# Patient Record
Sex: Female | Born: 1995 | Race: Black or African American | Hispanic: No | Marital: Single | Smoking: Current every day smoker
Health system: Southern US, Community
[De-identification: ages and names within clinical notes are randomized; demographics above are authoritative.]

## PROBLEM LIST (undated history)

## (undated) DIAGNOSIS — F909 Attention-deficit hyperactivity disorder, unspecified type: Secondary | ICD-10-CM

---
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
## 2016-08-31 ENCOUNTER — Encounter (HOSPITAL_COMMUNITY): Payer: Self-pay | Admitting: Emergency Medicine

## 2016-08-31 ENCOUNTER — Emergency Department (HOSPITAL_COMMUNITY)
Admission: EM | Admit: 2016-08-31 | Discharge: 2016-08-31 | Disposition: A | Discharge status: Home / Self Care | Payer: Medicaid Other | Attending: Emergency Medicine | Admitting: Emergency Medicine

## 2016-08-31 ENCOUNTER — Emergency Department (HOSPITAL_COMMUNITY): Payer: Medicaid Other

## 2016-08-31 DIAGNOSIS — Z79899 Other long term (current) drug therapy: Secondary | ICD-10-CM | POA: Insufficient documentation

## 2016-08-31 DIAGNOSIS — F17210 Nicotine dependence, cigarettes, uncomplicated: Secondary | ICD-10-CM | POA: Diagnosis not present

## 2016-08-31 DIAGNOSIS — J069 Acute upper respiratory infection, unspecified: Secondary | ICD-10-CM

## 2016-08-31 DIAGNOSIS — R05 Cough: Secondary | ICD-10-CM | POA: Diagnosis present

## 2016-08-31 MED ORDER — BENZONATATE 100 MG PO CAPS: 100.0000 mg | ORAL_CAPSULE | Freq: Three times a day (TID) | ORAL | 0 refills | Status: AC | PRN

## 2016-08-31 NOTE — ED Notes (Signed)
Pt well appearing, alert and oriented. Ambulates off unit at discharge

## 2016-08-31 NOTE — ED Notes (Signed)
Pt returned to room from xray.

## 2016-08-31 NOTE — ED Triage Notes (Signed)
Pt to ER for evaluation of productive cough and nasal drainage x2 days. VSS. Denies SOB. Lungs clear.

## 2016-08-31 NOTE — ED Provider Notes (Signed)
[facility] Provider Note   CSN: [id number] Arrival date & time: 08/31/16  1247     History   Chief Complaint Chief Complaint  Patient presents with  . URI    HPI Renee Hodge is a 21 y.o. female.  The history is provided by the patient. No language interpreter was used.  Cough  This is a new problem. The current episode started yesterday. The problem occurs every few minutes. The problem has not changed since onset.The cough is non-productive. Associated symptoms include rhinorrhea and sore throat. Pertinent negatives include no ear congestion, no ear pain, no shortness of breath and no wheezing. She has tried nothing for the symptoms. Her past medical history does not include pneumonia or asthma.    Past Medical History:  Diagnosis Date  . Attention deficit hyperactivity disorder     There are no active problems to display for this patient.   History reviewed. No pertinent surgical history.  OB History    No data available       Home Medications    Prior to Admission medications   Medication Sig Start Date End Date Taking? Authorizing Provider  amphetamine-dextroamphetamine (ADDERALL XR) 25 MG 24 hr capsule Take 25 mg by mouth every morning.    [provider]  benzonatate (TESSALON PERLES) 100 MG capsule Take 1 capsule (100 mg total) by mouth 3 (three) times daily as needed for cough. 08/31/16   [name], MD    Family History History reviewed. No pertinent family history.  Social History Social History  Substance Use Topics  . Smoking status: Current Every Day Smoker    Packs/day: 0.50    Types: Cigarettes  . Smokeless tobacco: Never Used  . Alcohol use No     Allergies   Patient has no known allergies.   Review of Systems Review of Systems  Constitutional: Negative for activity change, appetite change and fever.  HENT: Positive for congestion, rhinorrhea and sore throat. Negative for ear discharge and ear pain.     Respiratory: Negative for cough, shortness of breath and wheezing.   Gastrointestinal: Negative for abdominal pain, diarrhea, nausea and vomiting.  Genitourinary: Negative for decreased urine volume.  Skin: Negative for rash.  Neurological: Negative for weakness.     Physical Exam Updated Vital Signs BP 117/76 (BP Location: Right Arm)   Pulse 88   Temp 98.9 F (37.2 C) (Oral)   Resp 18   LMP 08/31/2016 (Exact Date)   SpO2 100%   Physical Exam  Constitutional: She appears well-developed and well-nourished. No distress.  HENT:  Head: Normocephalic and atraumatic.  Right Ear: External ear normal.  Left Ear: External ear normal.  Nose: Nose normal.  Eyes: Pupils are equal, round, and reactive to light. Conjunctivae are normal.  Neck: Neck supple.  Cardiovascular: Normal rate, regular rhythm, normal heart sounds and intact distal pulses.   No murmur heard. Pulmonary/Chest: Effort normal and breath sounds normal. No respiratory distress. She has no wheezes. She has no rales. She exhibits no tenderness.  Abdominal: Soft. There is no tenderness.  Lymphadenopathy:    She has no cervical adenopathy.  Neurological: She is alert. She exhibits normal muscle tone. Coordination normal.  Skin: Skin is warm. Capillary refill takes less than 2 seconds. No rash noted.  Psychiatric: She has a normal mood and affect.  Nursing note and vitals reviewed.    ED Treatments / Results  Labs (all labs ordered are listed, but only abnormal results are displayed)  Labs Reviewed - No data to display  EKG  EKG Interpretation None       Radiology Dg Chest 2 View  Result Date: 08/31/2016 CLINICAL DATA:  Cough for 2 days. EXAM: CHEST  2 VIEW COMPARISON:  None. FINDINGS: The heart size and mediastinal contours are within normal limits. Both lungs are clear. The visualized skeletal structures are unremarkable. IMPRESSION: No active cardiopulmonary disease. Electronically Signed   By: Elige Ko    On: 08/31/2016 14:06    Procedures Procedures (including critical care time)  Medications Ordered in ED Medications - No data to display   Initial Impression / Assessment and Plan / ED Course  I have reviewed the triage vital signs and the nursing notes.  Pertinent labs & imaging results that were available during my care of the patient were reviewed by me and considered in my medical decision making (see chart for details).     21 year old previously healthy female presents with 2 days of cough. Patient also reports upper respiratory congestion and sore throat. No previous history of albuterol use. She is eating and drinking normally. She denies fever, rash, difficulty breathing, abdominal pain or other associated symptoms. Vaccinations up-to-date.  On exam, patient is coughing frequently but in no acute distress. She appears well-hydrated. Her lungs are clear to auscultation bilaterally without increased work of breathing. Her posterior oropharynx is clear. Her abdomen soft nontender palpation.  Chest x-ray obtained and shows no acute cardiopulmonary process.  History and exam is consistent with URI. Recommend supportive care for symptomatic management. Patient given Jerilynn Som for cough. Return precautions discussed prior to discharge.  Final Clinical Impressions(s) / ED Diagnoses   Final diagnoses:  Upper respiratory tract infection, unspecified type    New Prescriptions New Prescriptions   BENZONATATE (TESSALON PERLES) 100 MG CAPSULE    Take 1 capsule (100 mg total) by mouth 3 (three) times daily as needed for cough.     [name], MD 08/31/16 [phone]

## 2016-08-31 NOTE — ED Notes (Signed)
Patient transported to X-ray 

## 2016-10-04 ENCOUNTER — Emergency Department (HOSPITAL_COMMUNITY)
Admission: EM | Admit: 2016-10-04 | Discharge: 2016-10-04 | Disposition: A | Discharge status: Home / Self Care | Payer: Medicaid Other | Attending: Emergency Medicine | Admitting: Emergency Medicine

## 2016-10-04 ENCOUNTER — Emergency Department (HOSPITAL_COMMUNITY): Payer: Medicaid Other

## 2016-10-04 ENCOUNTER — Encounter (HOSPITAL_COMMUNITY): Payer: Self-pay | Admitting: Emergency Medicine

## 2016-10-04 DIAGNOSIS — W458XXA Other foreign body or object entering through skin, initial encounter: Secondary | ICD-10-CM | POA: Diagnosis not present

## 2016-10-04 DIAGNOSIS — Y92008 Other place in unspecified non-institutional (private) residence as the place of occurrence of the external cause: Secondary | ICD-10-CM | POA: Diagnosis not present

## 2016-10-04 DIAGNOSIS — S61532A Puncture wound without foreign body of left wrist, initial encounter: Secondary | ICD-10-CM | POA: Diagnosis not present

## 2016-10-04 DIAGNOSIS — T148XXA Other injury of unspecified body region, initial encounter: Secondary | ICD-10-CM

## 2016-10-04 DIAGNOSIS — Z79899 Other long term (current) drug therapy: Secondary | ICD-10-CM | POA: Insufficient documentation

## 2016-10-04 DIAGNOSIS — Y999 Unspecified external cause status: Secondary | ICD-10-CM | POA: Diagnosis not present

## 2016-10-04 DIAGNOSIS — S6992XA Unspecified injury of left wrist, hand and finger(s), initial encounter: Secondary | ICD-10-CM | POA: Diagnosis present

## 2016-10-04 DIAGNOSIS — Y939 Activity, unspecified: Secondary | ICD-10-CM | POA: Insufficient documentation

## 2016-10-04 DIAGNOSIS — F17210 Nicotine dependence, cigarettes, uncomplicated: Secondary | ICD-10-CM | POA: Diagnosis not present

## 2016-10-04 NOTE — Discharge Instructions (Signed)
Apply ice intermittently. 3-4 times per day, 20 minutes at a time. Wear splint until pain-free. No limitations of use of the hand

## 2016-10-04 NOTE — ED Triage Notes (Signed)
Pt reports hitting her left hand on something sharp on a cough last night, has small puncture mark to left wrist. Unable to extend fingers, pulse intact.

## 2016-10-04 NOTE — ED Provider Notes (Signed)
[facility]-EMERGENCY DEPT Provider Note   CSN: [id number] Arrival date & time: 10/04/16  1154     History   Chief Complaint Chief Complaint  Patient presents with  . Hand Pain    HPI Renee Hodge is a 21 y.o. female. Chief complaint is wrist pain  HPI 21 year old female. States she is laying her couch yesterday. When she went to push up with her arms, something punctured her left wrist. The protruding piece of metal from underneath the patient in the couch.  States a left wrist is swollen and painful and she "can move my hand". She states it is painful to flex or extend her wrist or fingers. No tingling or paresthesias. No numbness. Reports good sensation. No bleeding.  Past Medical History:  Diagnosis Date  . Attention deficit hyperactivity disorder     There are no active problems to display for this patient.   History reviewed. No pertinent surgical history.  OB History    No data available       Home Medications    Prior to Admission medications   Medication Sig Start Date End Date Taking? Authorizing Provider  amphetamine-dextroamphetamine (ADDERALL XR) 25 MG 24 hr capsule Take 25 mg by mouth every morning.    [provider]  benzonatate (TESSALON PERLES) 100 MG capsule Take 1 capsule (100 mg total) by mouth 3 (three) times daily as needed for cough. 08/31/16   [name], MD    Family History No family history on file.  Social History Social History  Substance Use Topics  . Smoking status: Current Every Day Smoker    Packs/day: 0.50    Types: Cigarettes  . Smokeless tobacco: Never Used  . Alcohol use No     Allergies   Patient has no known allergies.   Review of Systems Review of Systems  Musculoskeletal:       Punctured the volar left wrist. Pain with movement of the wrist.   review systems otherwise complete, negative   Physical Exam Updated Vital Signs BP 129/80 (BP Location: Right Arm)   Pulse 90   Temp 98.4 F (36.9  C) (Oral)   Resp 16   Ht  (1.626 m)   Wt 81.6 kg (180 lb)   LMP 09/13/2016 (Approximate)   SpO2 99%   BMI 30.90 kg/m   Physical Exam  Musculoskeletal:  Small puncture volar left wrist at wrist crease. Overlying the palmaris longus tendon. She has normal sensation on the thenar eminence, hyperthenar eminence, dorsally. She is able to make a fist. She is able to have adduct the fingers. She can fully extend the digits and wrist. Minimal to no localized soft tissue swelling.     ED Treatments / Results  Labs (all labs ordered are listed, but only abnormal results are displayed) Labs Reviewed - No data to display  EKG  EKG Interpretation None       Radiology No results found.  Procedures Procedures (including critical care time)  Medications Ordered in ED Medications - No data to display   Initial Impression / Assessment and Plan / ED Course  I have reviewed the triage vital signs and the nursing notes.  Pertinent labs & imaging results that were available during my care of the patient were reviewed by me and considered in my medical decision making (see chart for details).     Punctured her wrist with pain. No sign of nerve injury. Worse case scenario is this struck tendon, or median  nerve sheet with likely have neuropraxia which showed expected temporary. X-ray shows no foreign body or fracture. Plan a splint. Intermittent ice. Normal use of the hand. Primary care follow-up if not improving.  Final Clinical Impressions(s) / ED Diagnoses   Final diagnoses:  Puncture wound    New Prescriptions New Prescriptions   No medications on file     Rolland Porter, MD 10/04/16 1344

## 2019-04-03 IMAGING — DX DG CHEST 2V
2 series · 2 of 2 positions shown · non-contrast
Comparison: None.

CLINICAL DATA: Cough for 2 days.

EXAM:
CHEST  2 VIEW

[chest pa]
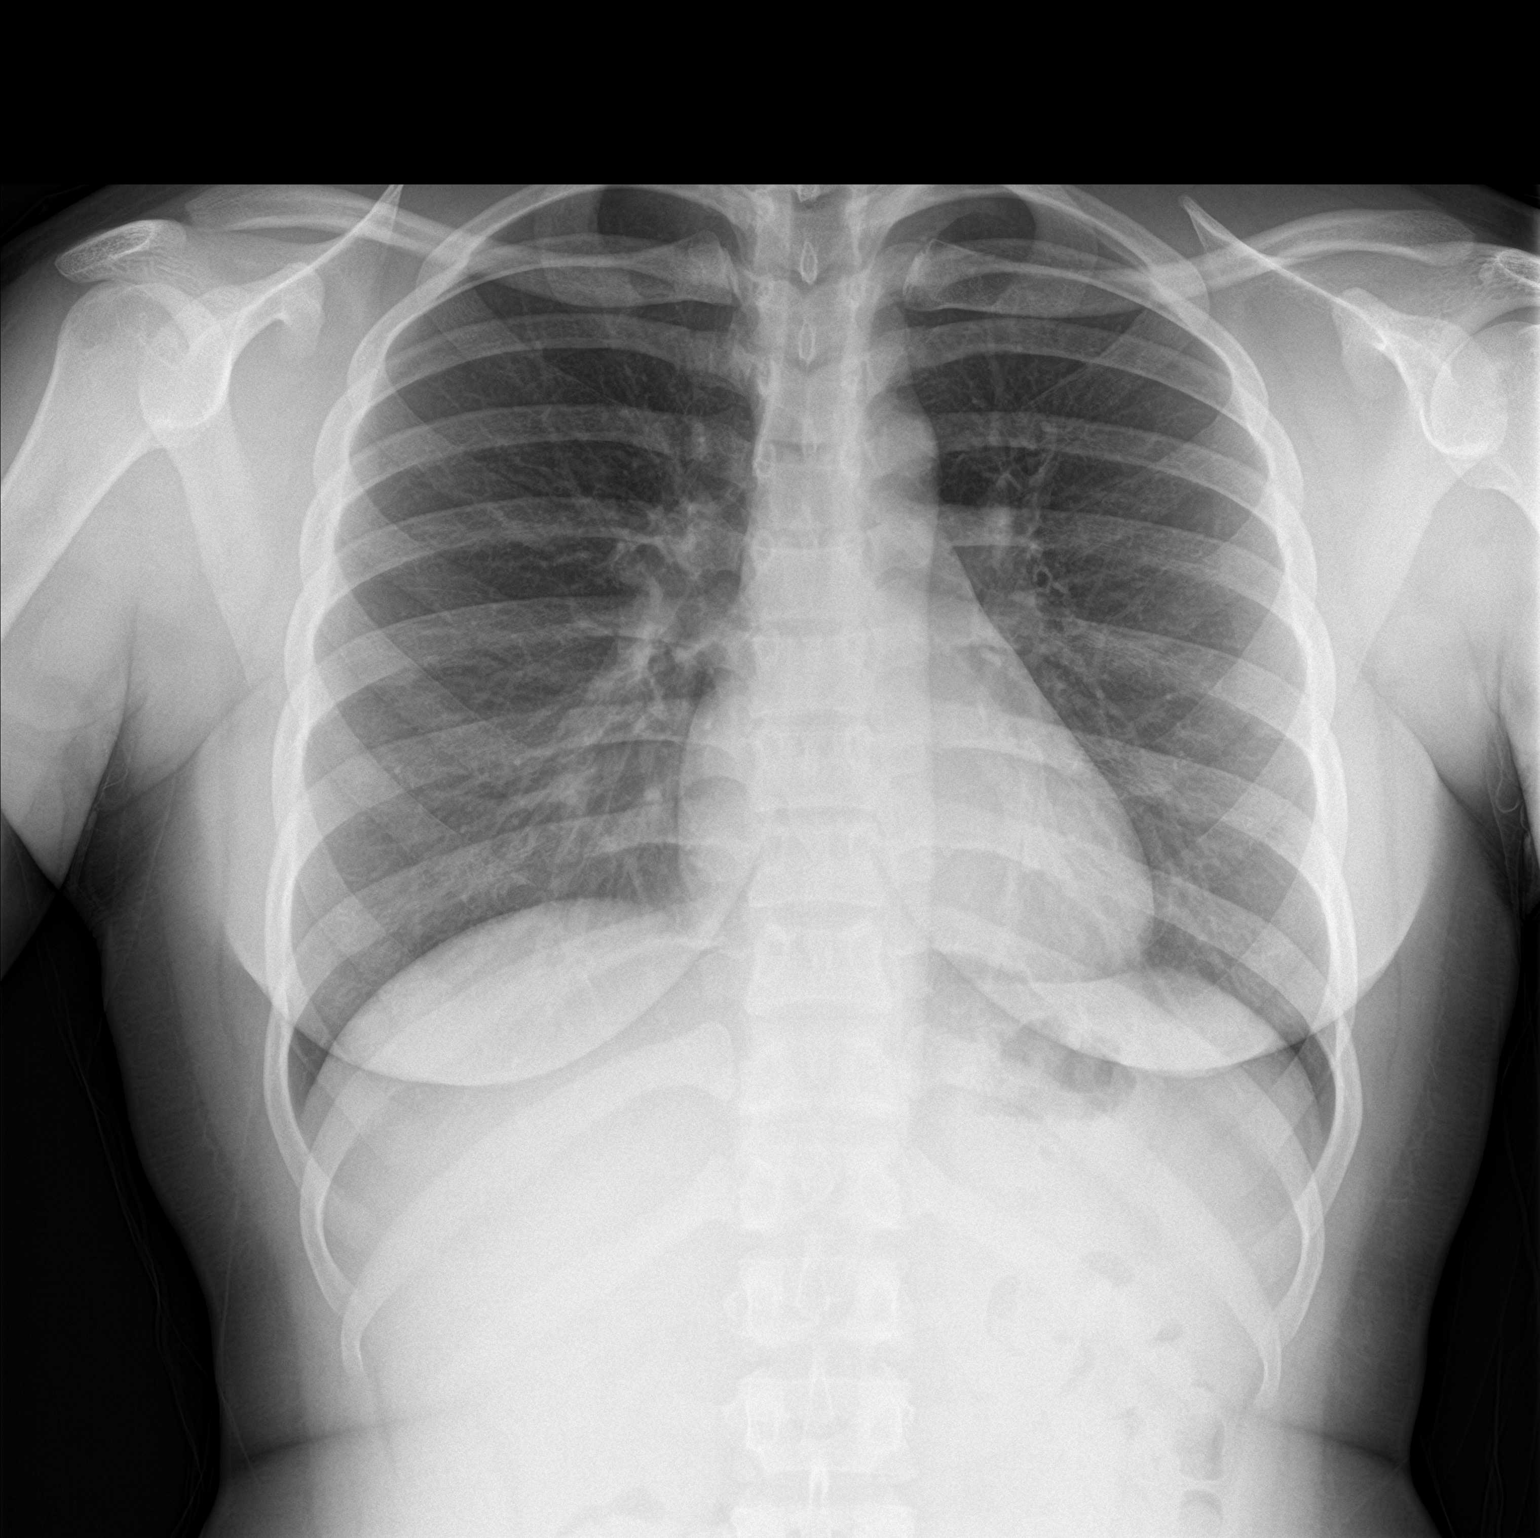

[chest lat]
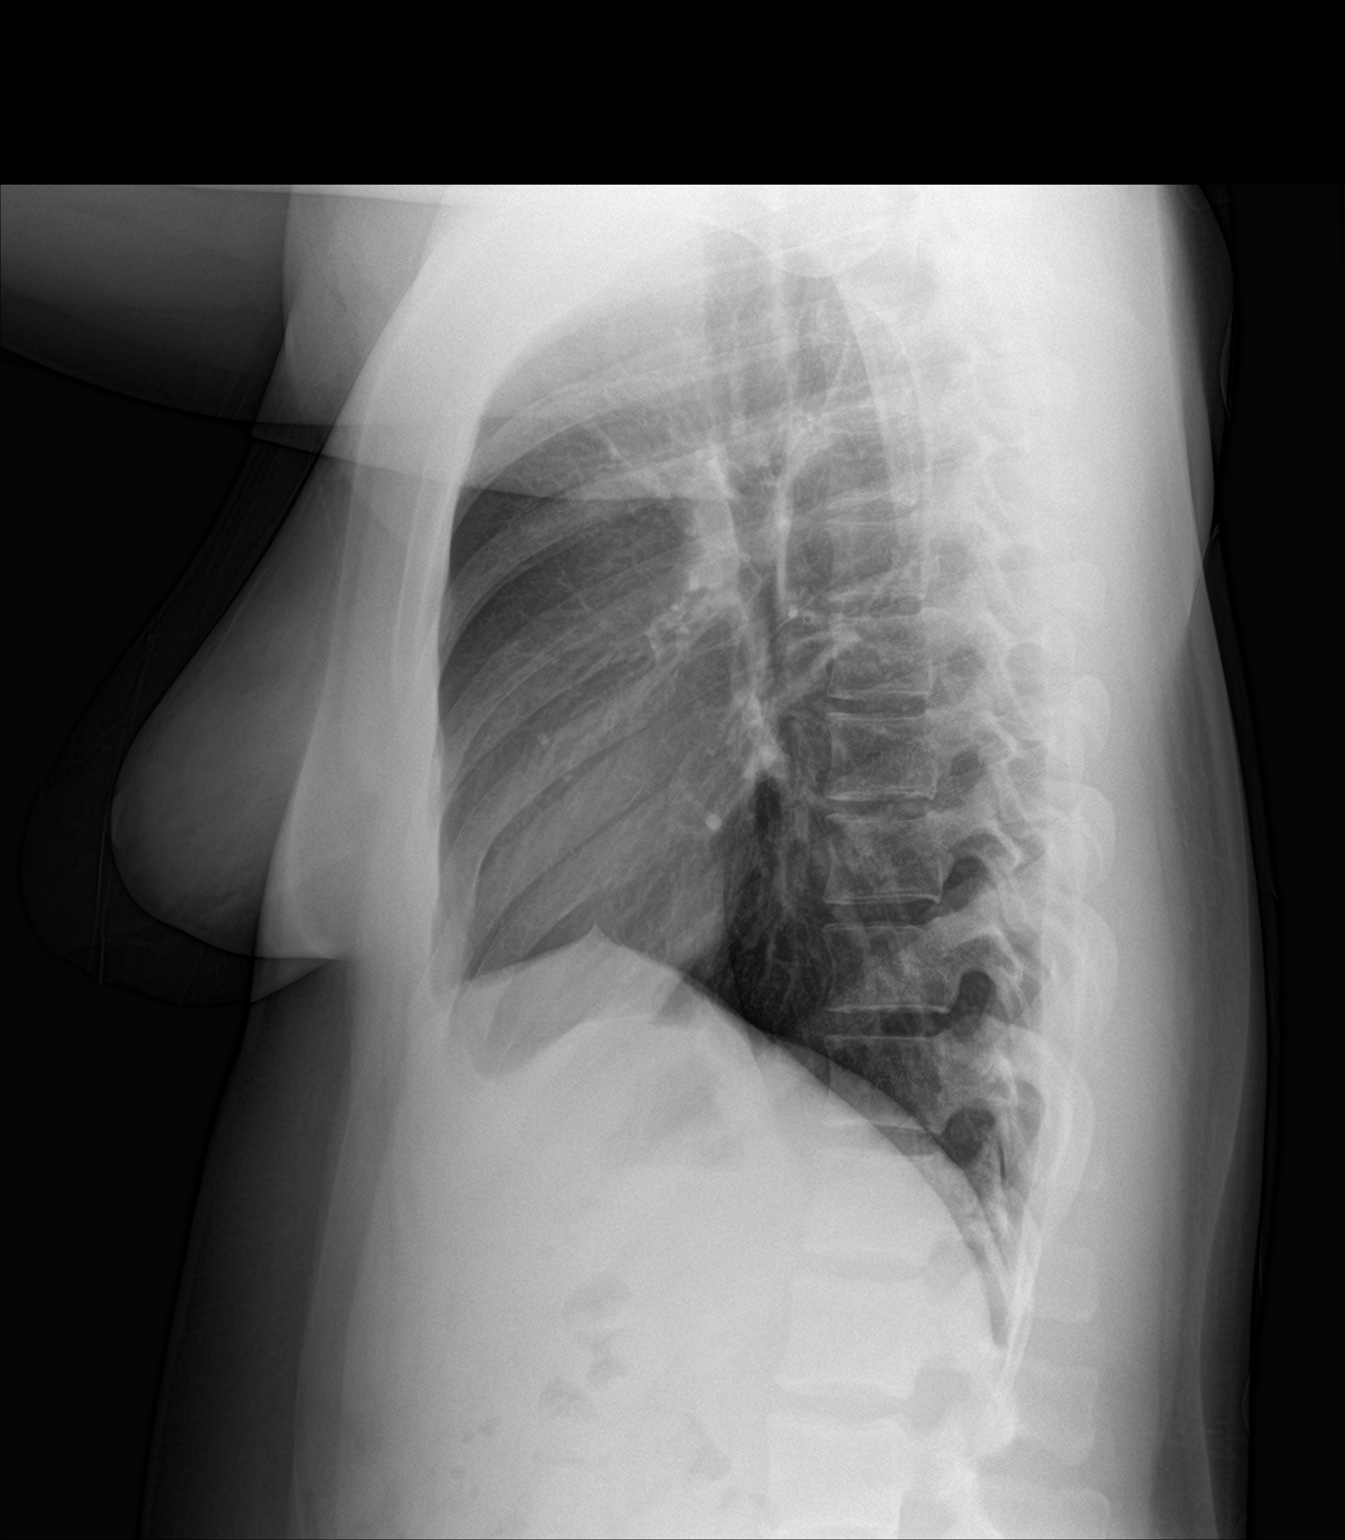

[2 of 2 positions shown; findings below may reference images not displayed]

FINDINGS: The heart size and mediastinal contours are within normal limits.
Both lungs are clear. The visualized skeletal structures are
unremarkable.
IMPRESSION: No active cardiopulmonary disease.
